# Patient Record
Sex: Male | Born: 1935 | Hispanic: No | Marital: Married
Health system: Southern US, Community
[De-identification: ages and names within clinical notes are randomized; demographics above are authoritative.]

---

## 2006-07-30 ENCOUNTER — Ambulatory Visit (HOSPITAL_COMMUNITY): Admission: RE | Admit: 2006-07-30 | Discharge: 2006-07-31 | Payer: Self-pay | Admitting: Surgery

## 2006-10-27 IMAGING — CT CT ABD-PELV W/O CM
4 of 8 series · 13 of 42 positions shown, 19 images · IV contrast (CONTRAST)
Comparison: NONE

CLINICAL DATA: RADIOLOGY REPORT - REVISED (Signature)
HISTORY: Nausea,  vomiting.  Evaluate for cholecystitis. 

CT ABDOMEN AND PELVIS WITHOUT AND WITH INTRAVENOUS AND FOLLOWING 
ORAL  CONTRAST
TECHNIQUE: Multiple axial 5-millimeter thick slices at 
5-millimeter intervals were obtained from the lung base through 
the pelvis following the intravenous administration of 100 cc of 
Optiray 350 at a rate of 3 cc per second.  Oral contrast was 
administered as well.  Arterial and venous phase imaging was 
obtained in the upper abdomen with delayed images obtained through 
the pelvis.

[Series 2: wo · axial · 0.81mm/px · z∈[+1456,+1546]mm · 2 of 55 slices shown]
[im 19/55  soft-tissue]
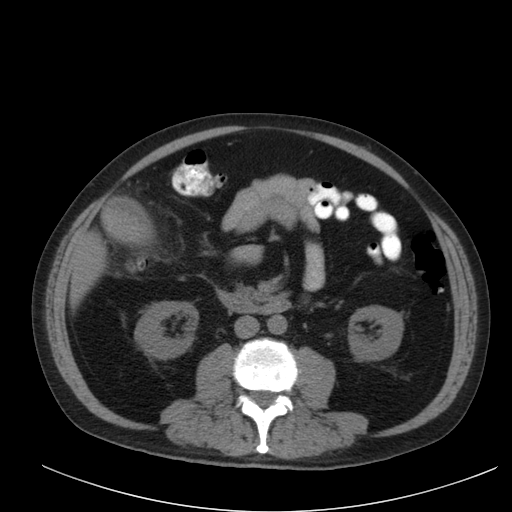
[im 37/55  soft-tissue]
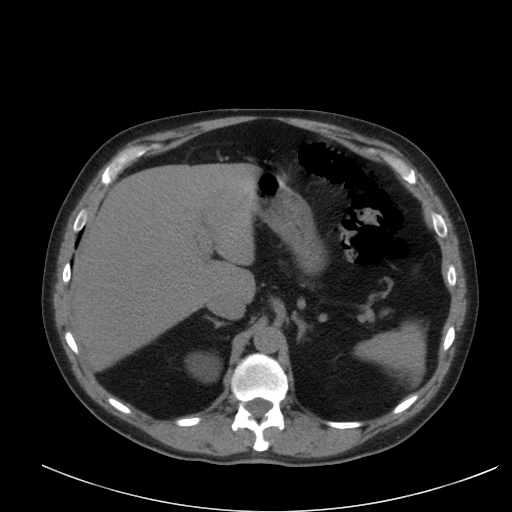

[Series 4: venous · axial · portal-venous · 0.81mm/px · z∈[+1257,+1557]mm · 5 of 92 slices shown, 10 images]
[im 16/92  soft-tissue]
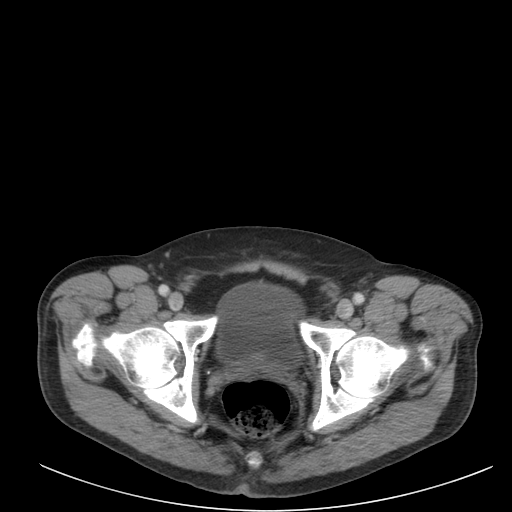
[im 16/92  bone]
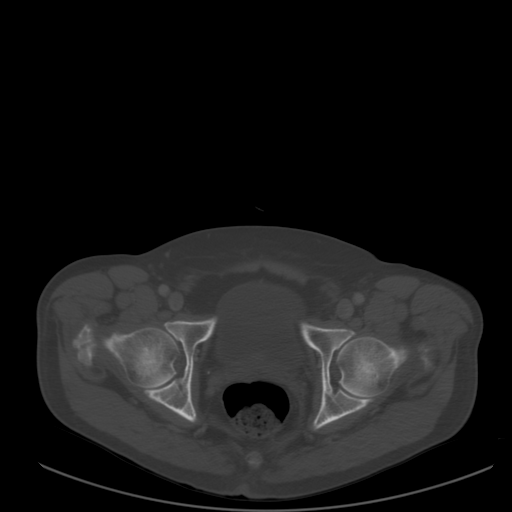
[im 31/92  soft-tissue]
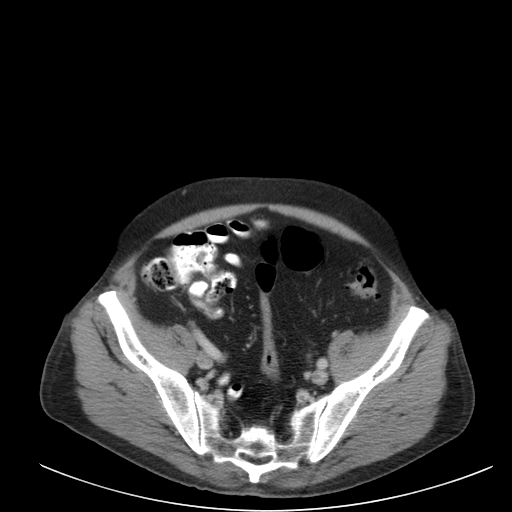
[im 31/92  lung]
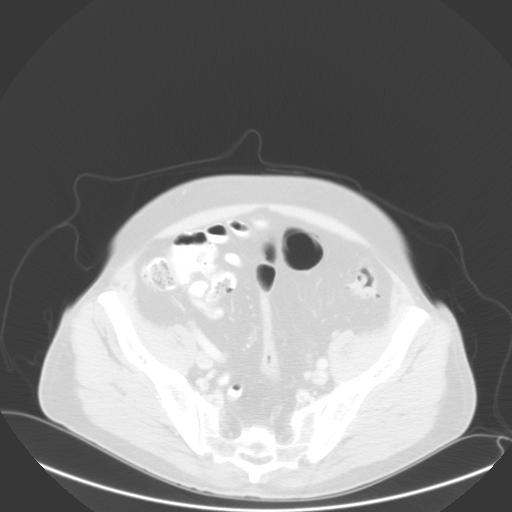
[im 46/92  soft-tissue]
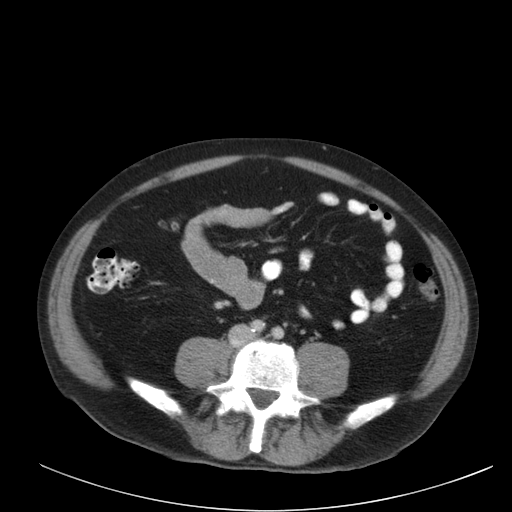
[im 46/92  lung]
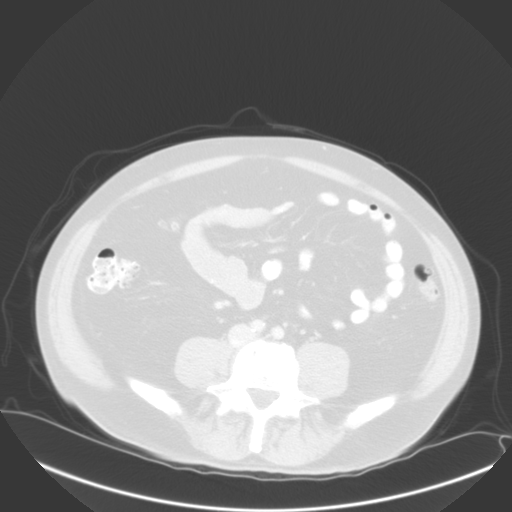
[im 61/92  soft-tissue]
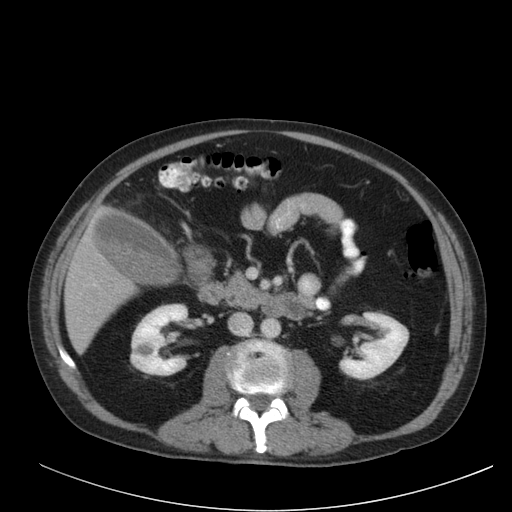
[im 61/92  lung]
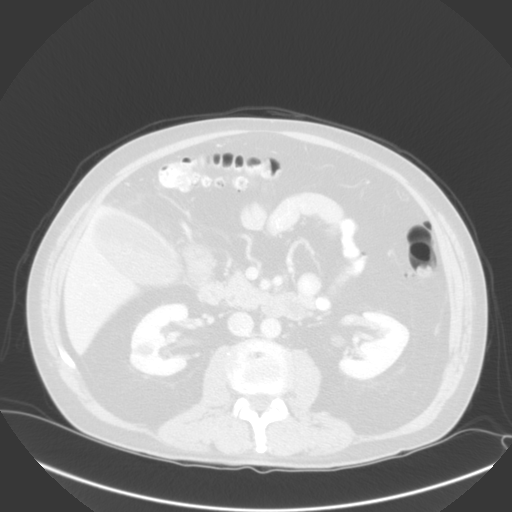
[im 76/92  soft-tissue]
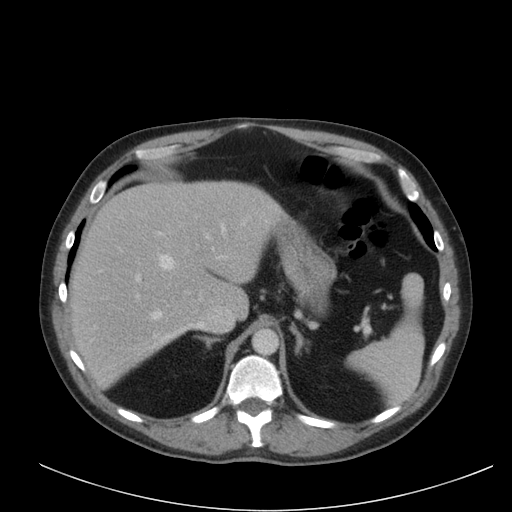
[im 76/92  lung]
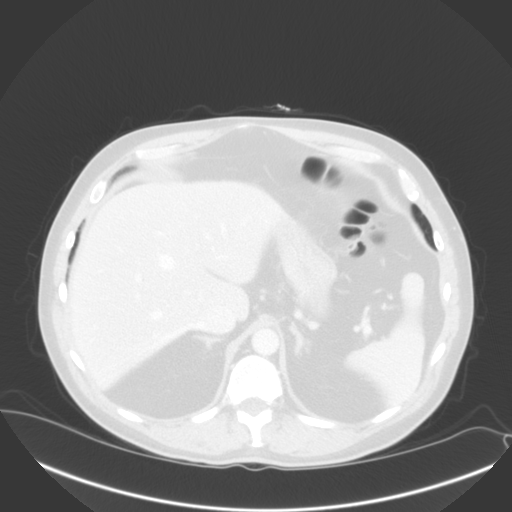

[Series 9: bladder delays · axial · 0.79mm/px · z∈[+1324,+1504]mm · 3 of 73 slices shown]
[im 19/73  soft-tissue]
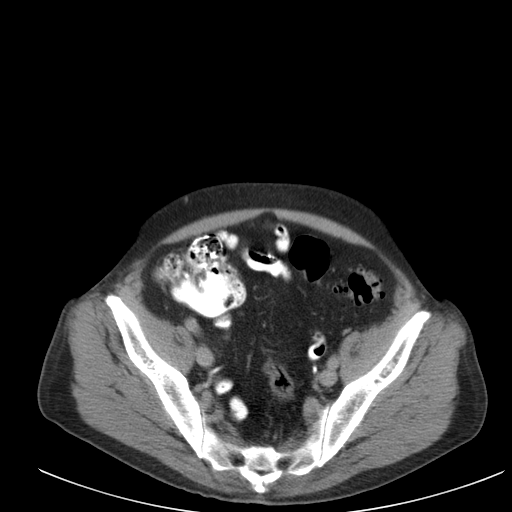
[im 37/73  soft-tissue]
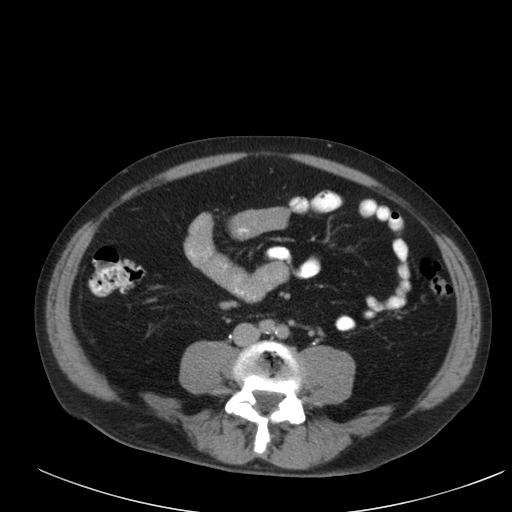
[im 55/73  soft-tissue]
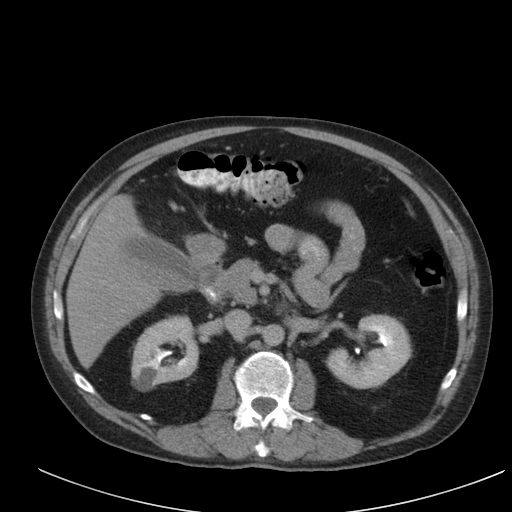

[coronals · coronal · 0.89mm/px · 3 of 92 slices shown, 4 images]
[im 31/92  soft-tissue]
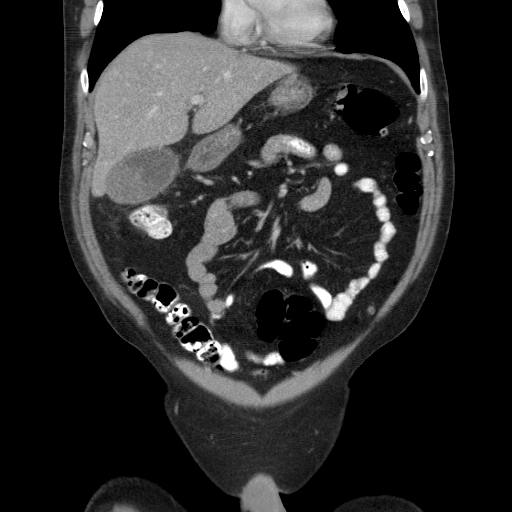
[im 41/92  soft-tissue]
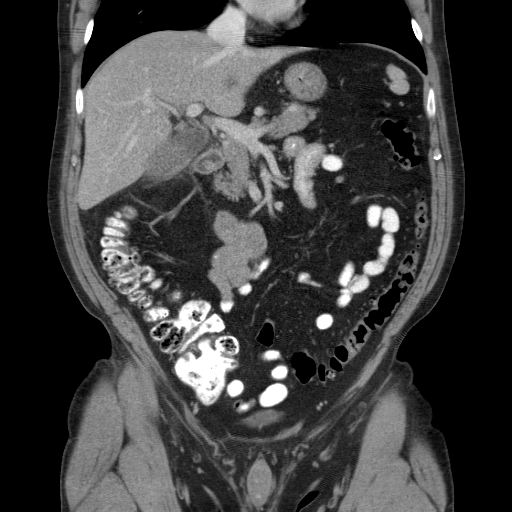
[im 41/92  bone]
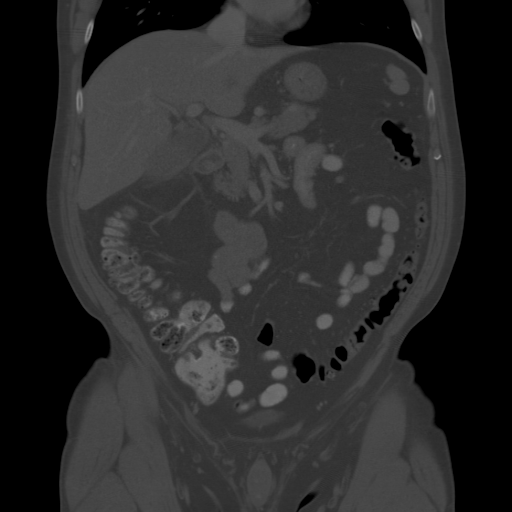
[im 51/92  soft-tissue]
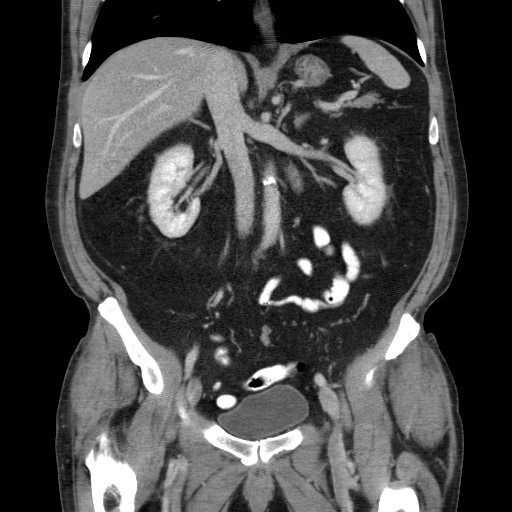

[13 of 42 positions shown; findings below may reference images not displayed]

FINDINGS: The study confirms a distended thickened gallbladder 
filled with tiny stones.  Moderate pericholecystic edema is noted. 
 No evidence for perforation.  No biliary ductal dilation.  
Pancreas and spleen appear normal.  The kidneys contain several 
small cysts and one larger right upper pole renal cyst, 4.0 cm in 
diameter.  No renal stones or hydronephrosis are evident. Bowel 
and mesentery appear normal.  Bladder, seminal vesicles, and 
prostate appear normal.
IMPRESSION: Cholelithiasis and acute, moderate to severe 
on 06/05/2006 Dict Date: 06/04/2006  Tran Date: 06/05/2006 [facility]  
JLM

## 2007-07-08 IMAGING — RF DG CHOLANGIOGRAM OPERATIVE
1 series · 3 of 3 positions shown · non-contrast
Comparison: none

CLINICAL DATA: Biliary dyskinesia

[Series 1: run · 3 of 65 frames shown]
[frame 10/65]
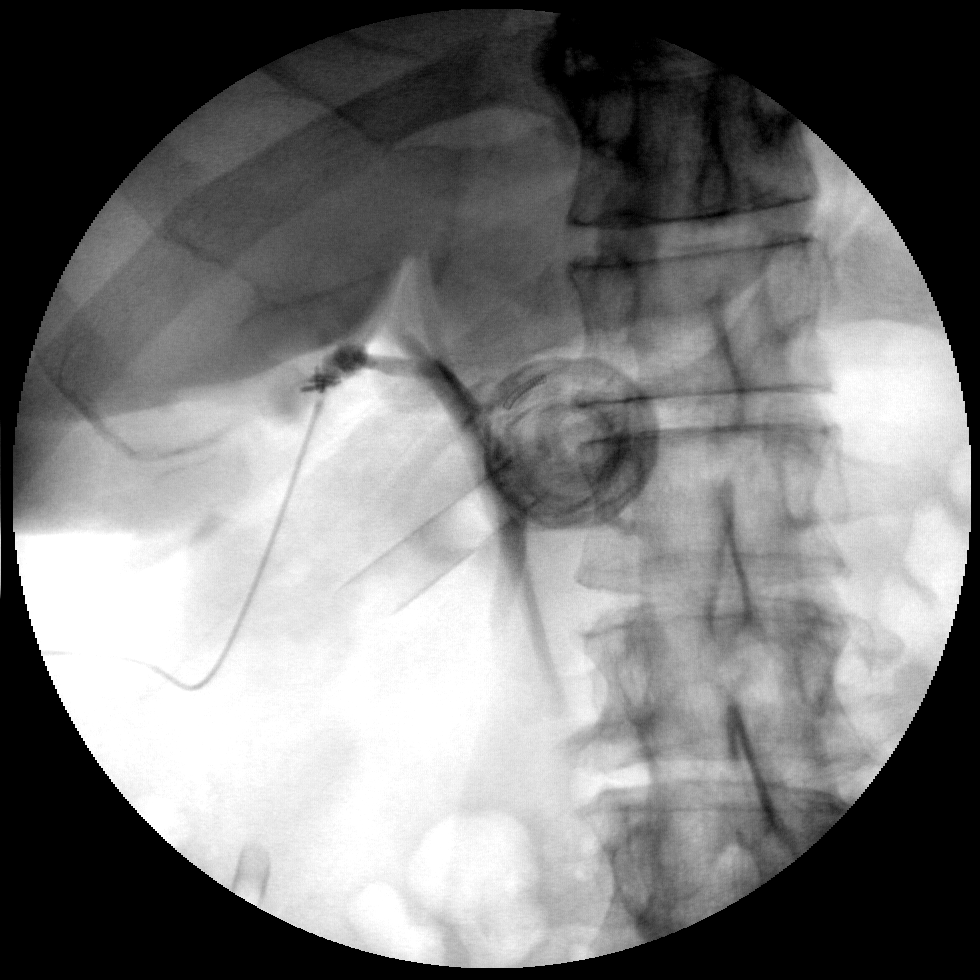
[frame 33/65]
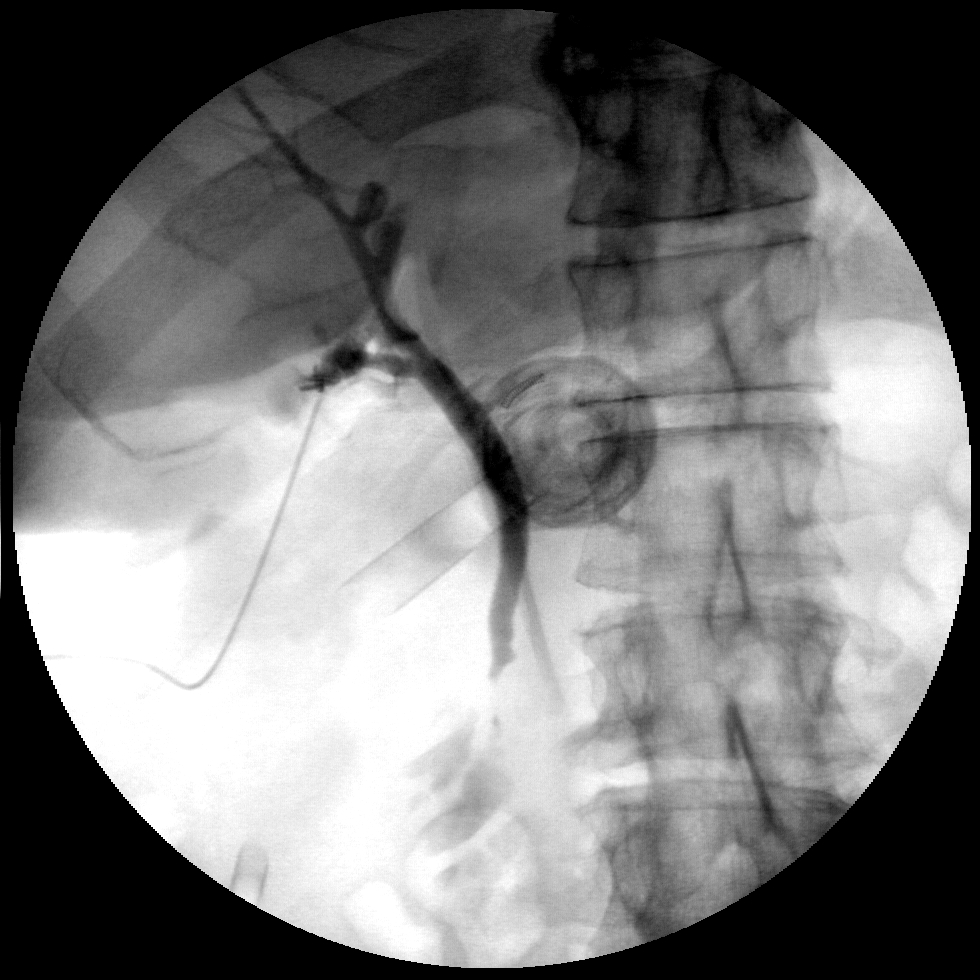
[frame 56/65]
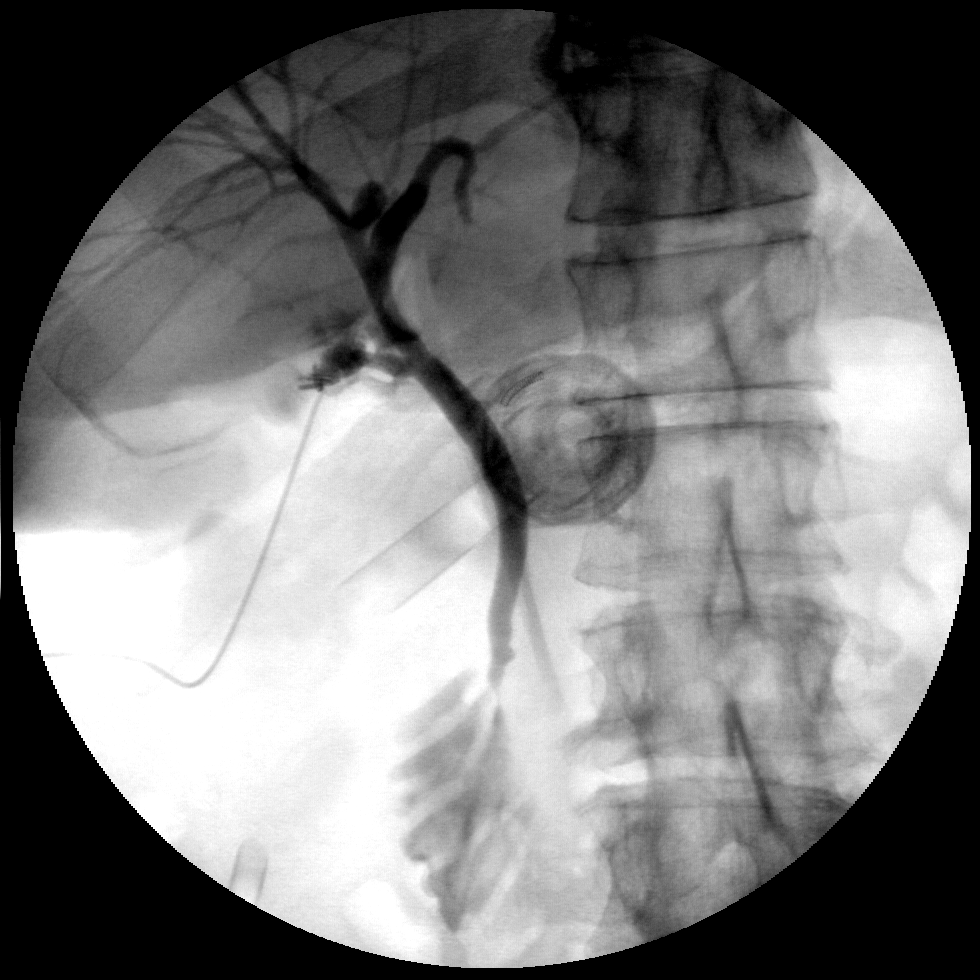

[3 of 3 positions shown; findings below may reference images not displayed]

INTRAOPERATIVE CHOLANGIOGRAM:

65  images from intraoperative C-arm fluoroscopy demonstrate  opacification of
the common bile duct. No filling defects to suggest retained stones. There is
incomplete evaluation of intrahepatic biliary tree, which appears decompressed
centrally. Contrast appears to flow on into decompressed duodenum.
IMPRESSION: 1. Negative for retained common duct stone

## 2017-10-03 ENCOUNTER — Telehealth: Payer: Self-pay

## 2017-10-22 NOTE — Telephone Encounter (Signed)
error 

## 2019-03-16 ENCOUNTER — Other Ambulatory Visit: Payer: Self-pay | Admitting: Ophthalmology

## 2020-06-22 ENCOUNTER — Ambulatory Visit (INDEPENDENT_AMBULATORY_CARE_PROVIDER_SITE_OTHER): Payer: Medicare Other | Admitting: Otolaryngology

## 2020-06-22 ENCOUNTER — Other Ambulatory Visit: Payer: Self-pay

## 2020-06-22 VITALS — Temp 97.5°F

## 2020-06-22 DIAGNOSIS — H6123 Impacted cerumen, bilateral: Secondary | ICD-10-CM | POA: Diagnosis not present

## 2020-06-22 DIAGNOSIS — H903 Sensorineural hearing loss, bilateral: Secondary | ICD-10-CM | POA: Diagnosis not present

## 2020-06-22 NOTE — Progress Notes (Signed)
HPI: Jacob Moon is a 84 y.o. male who presents for evaluation of wax buildup in both ears.  He has bilateral hearing aids and has decreased hearing because of wax buildup in both ears.  [facility] is referred [facility] from [facility].Marland Kitchen  No past medical history on file.  Social History   Socioeconomic History  . Marital status: Married    Spouse name: Not on file  . Number of children: Not on file  . Years of education: Not on file  . Highest education level: Not on file  Occupational History  . Not on file  Tobacco Use  . Smoking status: Not on file  Substance and Sexual Activity  . Alcohol use: Not on file  . Drug use: Not on file  . Sexual activity: Not on file  Other Topics Concern  . Not on file  Social History Narrative  . Not on file   Social Determinants of Health   Financial Resource Strain:   . Difficulty of Paying Living Expenses: Not on file  Food Insecurity:   . Worried About Programme researcher, broadcasting/film/video in the Last Year: Not on file  . Ran Out of Food in the Last Year: Not on file  Transportation Needs:   . Lack of Transportation (Medical): Not on file  . Lack of Transportation (Non-Medical): Not on file  Physical Activity:   . Days of Exercise per Week: Not on file  . Minutes of Exercise per Session: Not on file  Stress:   . Feeling of Stress : Not on file  Social Connections:   . Frequency of Communication with Friends and Family: Not on file  . Frequency of Social Gatherings with Friends and Family: Not on file  . Attends Religious Services: Not on file  . Active Member of Clubs or Organizations: Not on file  . Attends Banker Meetings: Not on file  . Marital Status: Not on file   No family history on file. No Known Allergies Prior to Admission medications   Not on File     Positive ROS: Otherwise negative  All other systems have been reviewed and were otherwise negative with the exception of those mentioned in the HPI and as  above.  Physical Exam: Constitutional: Alert, well-appearing, no acute distress Ears: External ears without lesions or tenderness. Ear canals with a large amount of wax in both ear canals that was cleaned with suction.  Left side was worse than right.. Nasal: External nose without lesions. Clear nasal passages Oral: Oropharynx clear. Neck: No palpable adenopathy or masses Respiratory: Breathing comfortably  Skin: No facial/neck lesions or rash noted.  Cerumen impaction removal  Date/Time: 06/22/2020 11:24 AM Performed by: Drema Halon, MD Authorized by: Drema Halon, MD   Consent:    Consent obtained:  Verbal   Consent given by:  Patient   Risks discussed:  Pain and bleeding Procedure details:    Location:  L ear and R ear   Procedure type: curette and suction   Post-procedure details:    Inspection:  TM intact and canal normal   Hearing quality:  Improved   Patient tolerance of procedure:  Tolerated well, no immediate complications Comments:     TMs are clear bilaterally.    Assessment: Bilateral cerumen impactions in patient wearing hearing aids.  Plan: Ear canals were cleaned in the office today.  He will follow-up as needed.  Narda Bonds, MD

## 2021-09-16 DEATH — deceased
